# Patient Record
Sex: Male | Born: 1995 | Hispanic: Yes | Marital: Single | Smoking: Current every day smoker
Health system: Southern US, Community
[De-identification: ages and names within clinical notes are randomized; demographics above are authoritative.]

---
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
## 2015-12-09 IMAGING — CR DG ABDOMEN 2V
2 series · 2 of 2 positions shown · non-contrast
Comparison: Rib series from today reported separately.

CLINICAL DATA: 19-year-old male with abdominal pain, right upper
quadrant pain after a fall. Initial encounter.

EXAM:
ABDOMEN - 2 VIEW

[AP (1 of 2)]
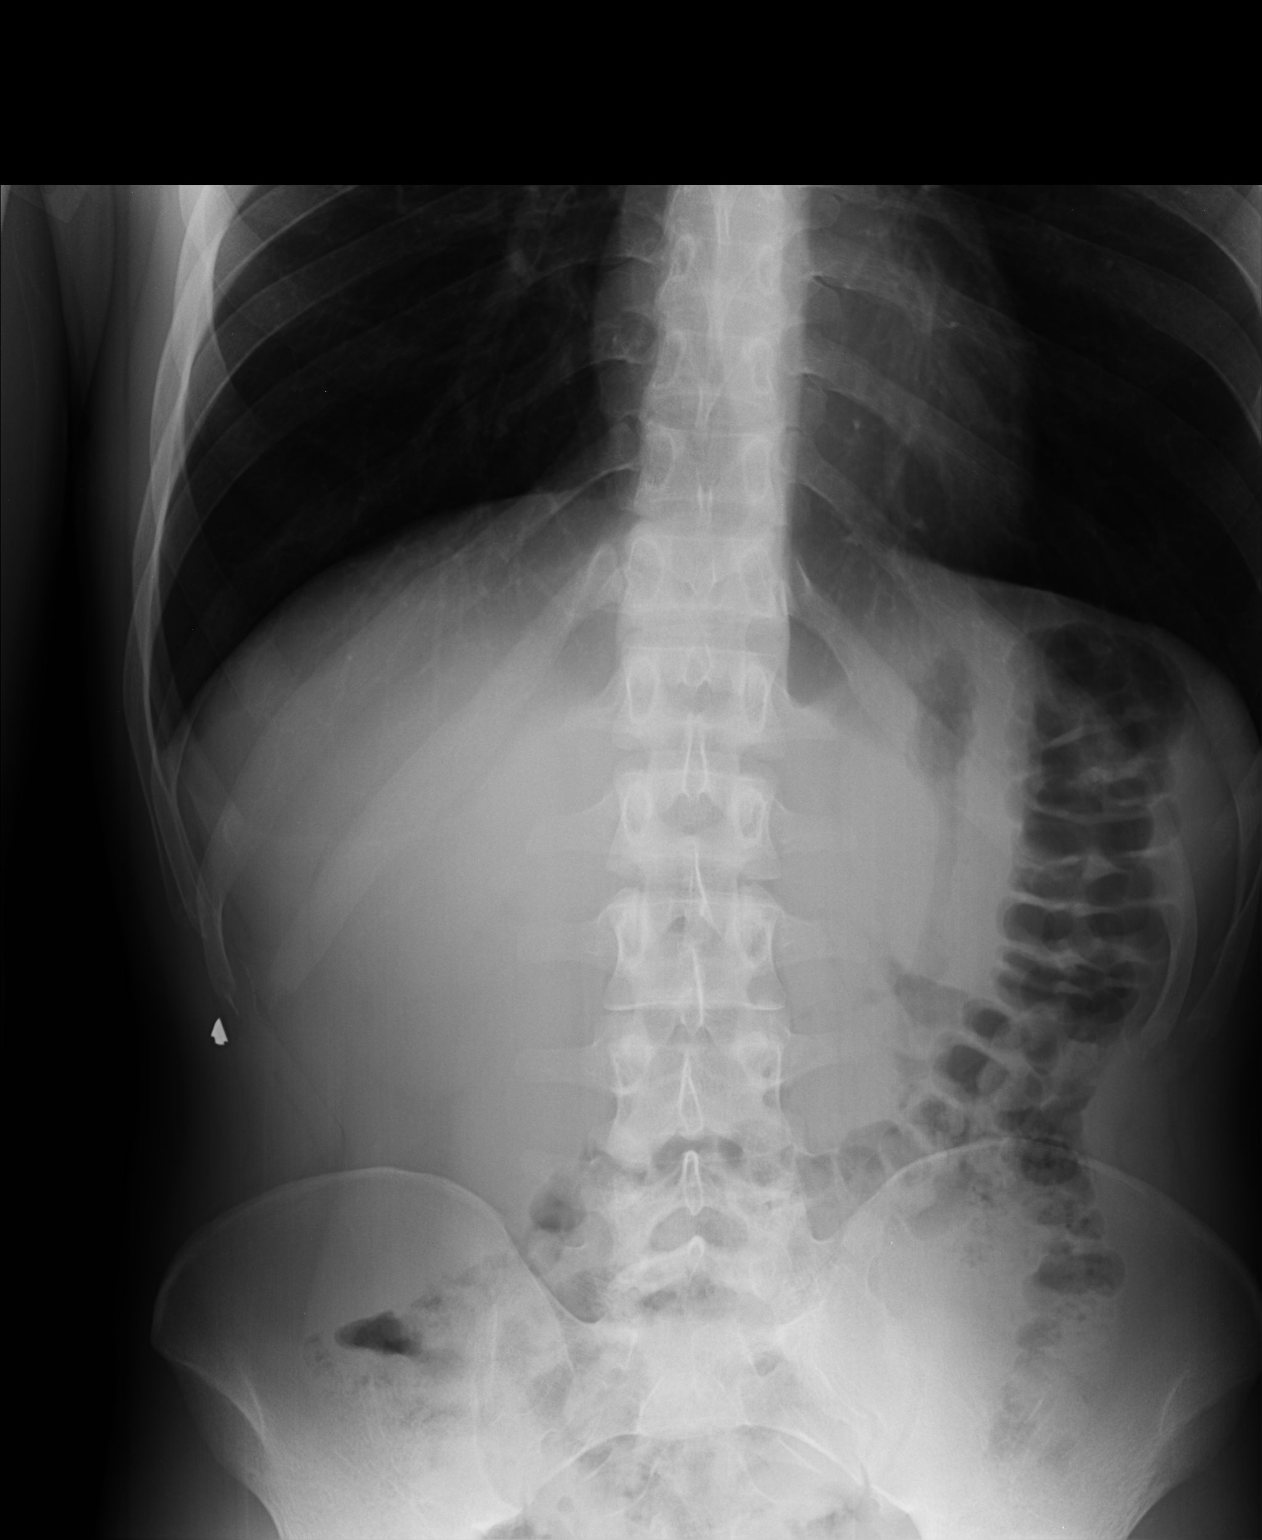

[AP (2 of 2)]
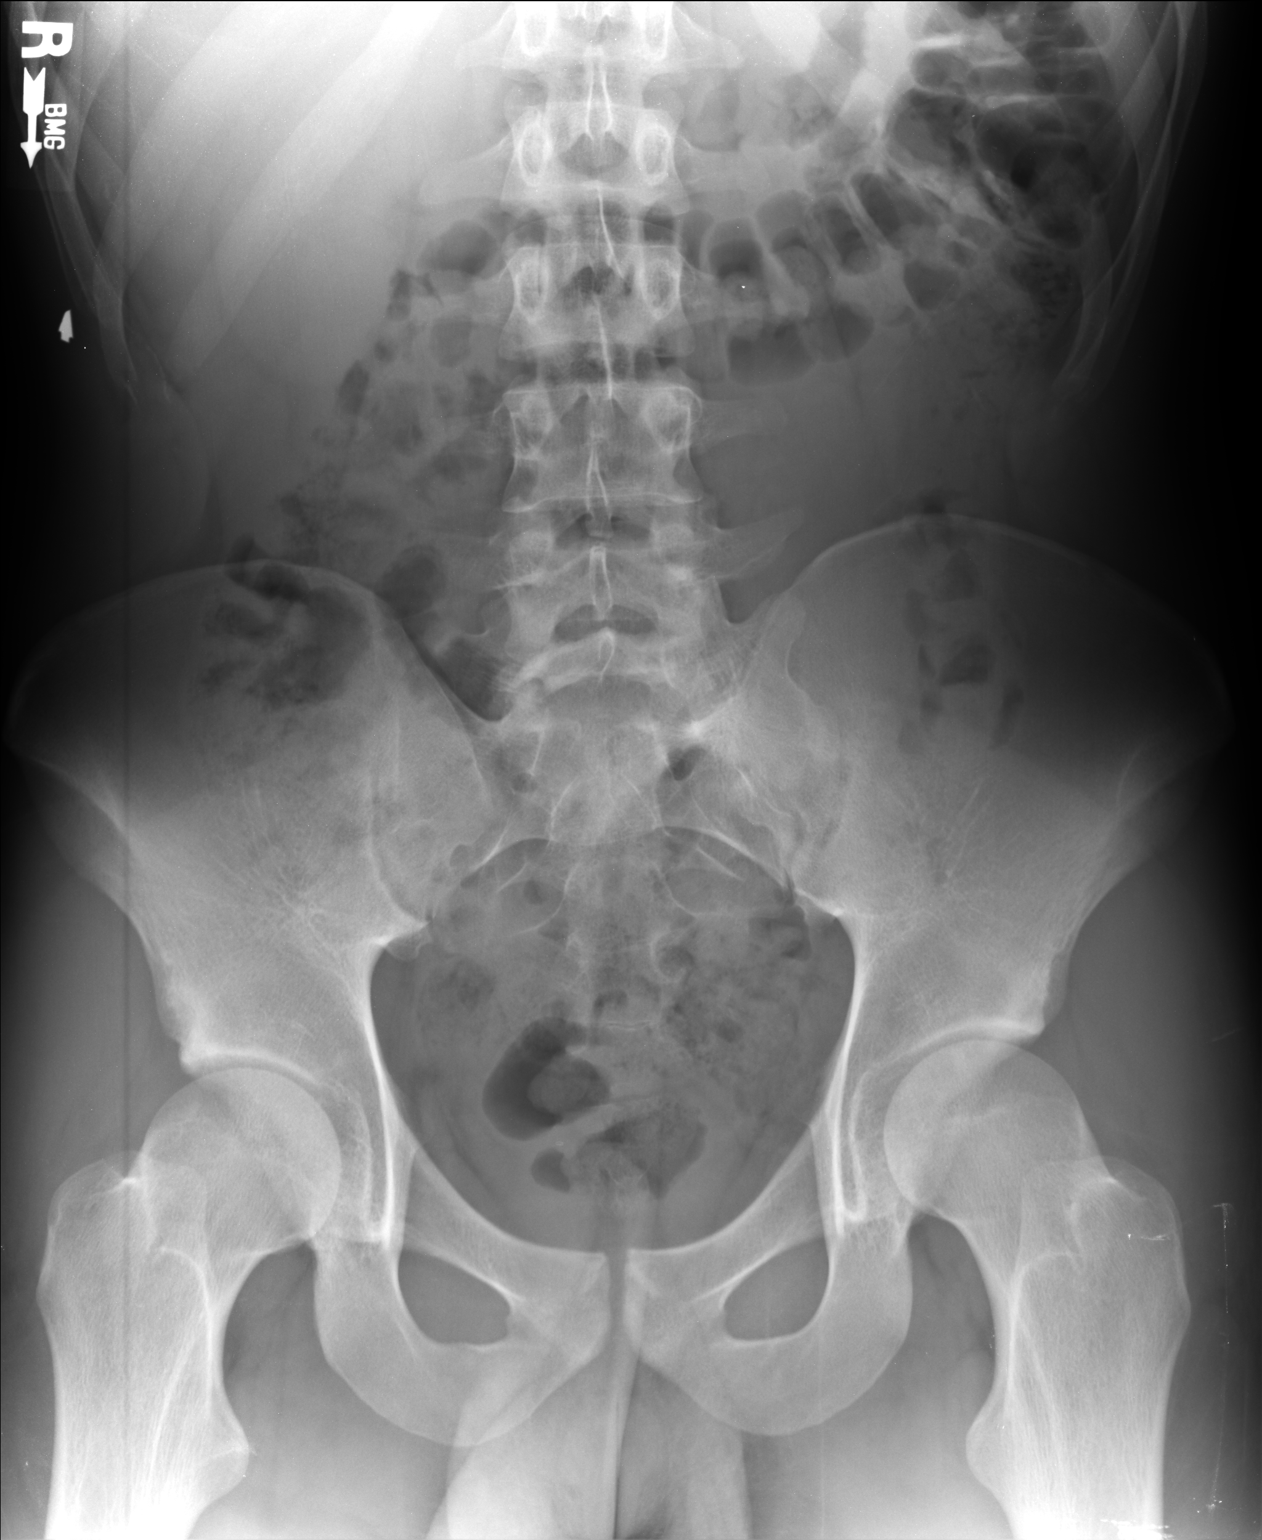

[2 of 2 positions shown; findings below may reference images not displayed]

FINDINGS: Negative visualized lung bases. No pneumoperitoneum. Normal bowel
gas pattern. Abdominal and pelvic visceral contours are within
normal limits.

The S1 level may be partially lumbarized, uncertain. Normal bone
mineralization and no acute osseous abnormality in the lumbar spine
or about the pelvis. R rib marker is in place at the right
eleventh/12 costochondral junction level. No displaced right lower
rib fracture is identified.
IMPRESSION: Normal bowel gas pattern, no free air.
